# Patient Record
Sex: Female | Born: 1995 | Race: White | Hispanic: No | Marital: Single | Smoking: Never smoker
Health system: Southern US, Community
[De-identification: ages and names within clinical notes are randomized; demographics above are authoritative.]

## PROBLEM LIST (undated history)

## (undated) ENCOUNTER — Ambulatory Visit: Admission: EM | Payer: Managed Care, Other (non HMO) | Source: Home / Self Care

## (undated) HISTORY — PX: SALIVARY GLAND SURGERY: SHX768

---
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
## 2018-02-28 ENCOUNTER — Encounter: Payer: Self-pay | Admitting: Emergency Medicine

## 2018-02-28 ENCOUNTER — Other Ambulatory Visit: Payer: Self-pay

## 2018-02-28 ENCOUNTER — Ambulatory Visit
Admission: EM | Admit: 2018-02-28 | Discharge: 2018-02-28 | Disposition: A | Discharge status: Home / Self Care | Payer: 59 | Attending: Family Medicine | Admitting: Family Medicine

## 2018-02-28 DIAGNOSIS — J029 Acute pharyngitis, unspecified: Secondary | ICD-10-CM

## 2018-02-28 LAB — MONONUCLEOSIS SCREEN: MONO SCREEN: NEGATIVE

## 2018-02-28 LAB — RAPID STREP SCREEN (MED CTR MEBANE ONLY): Streptococcus, Group A Screen (Direct): NEGATIVE

## 2018-02-28 MED ORDER — BENZONATATE 100 MG PO CAPS: 100.0000 mg | ORAL_CAPSULE | Freq: Three times a day (TID) | ORAL | 0 refills | Status: AC | PRN

## 2018-02-28 NOTE — ED Provider Notes (Signed)
[facility]    CSN: [id number] Arrival date & time: 02/28/18  1328  History   Chief Complaint Chief Complaint  Patient presents with  . Sore Throat   HPI  22 year old female presents with the above complaint.  Patient reports that her symptoms started last Saturday.  Patient reports sore throat, cough, congestion, hoarseness.  Has also had left lower rib pain.  Roommate has had mononucleosis.  She is concerned about that.  No fever.  No known exacerbating or relieving factors.  No other associated symptoms.  No other complaints.  PMH, Surgical Hx, Family Hx, Social History reviewed and updated as below.  No significant PMH.  Past Surgical History:  Procedure Laterality Date  . SALIVARY GLAND SURGERY     due to stone production    OB History   None    Home Medications    Prior to Admission medications   Medication Sig Start Date End Date Taking? Authorizing Provider  LARISSIA 0.1-20 MG-MCG tablet  02/27/18  Yes [provider]  benzonatate (TESSALON) 100 MG capsule Take 1 capsule (100 mg total) by mouth 3 (three) times daily as needed. 02/28/18   Tommie Samsook, Khush Pasion G, DO    Family History Family History  Problem Relation Age of Onset  . Healthy Mother   . Healthy Father     Social History Social History   Tobacco Use  . Smoking status: Never Smoker  . Smokeless tobacco: Never Used  Substance Use Topics  . Alcohol use: Yes    Frequency: Never  . Drug use: Never     Allergies   Codeine and Sulfa antibiotics   Review of Systems Review of Systems Per HPI  Physical Exam Triage Vital Signs ED Triage Vitals  Enc Vitals Group     BP 02/28/18 1348 (!) 135/95     Pulse Rate 02/28/18 1348 85     Resp 02/28/18 1348 16     Temp 02/28/18 1348 (!) 97.5 F (36.4 C)     Temp Source 02/28/18 1348 Oral     SpO2 02/28/18 1348 98 %     Weight 02/28/18 1344 150 lb (68 kg)     Height 02/28/18 1344 5\' 2"  (1.575 m)     Head Circumference --    Peak Flow --      Pain Score 02/28/18 1344 3     Pain Loc --      Pain Edu? --      Excl. in GC? --    Updated Vital Signs BP (!) 135/95 (BP Location: Left Arm)   Pulse 85   Temp (!) 97.5 F (36.4 C) (Oral)   Resp 16   Ht 5\' 2"  (1.575 m)   Wt 68 kg   LMP 01/27/2018   SpO2 98%   BMI 27.44 kg/m   Visual Acuity Right Eye Distance:   Left Eye Distance:   Bilateral Distance:    Right Eye Near:   Left Eye Near:    Bilateral Near:     Physical Exam  Constitutional: She is oriented to person, place, and time. She appears well-developed. No distress.  HENT:  Head: Normocephalic and atraumatic.  Oropharynx with mild erythema.  No exudate.  Neck: Neck supple.  Cardiovascular: Normal rate and regular rhythm.  Pulmonary/Chest: Effort normal and breath sounds normal. She has no wheezes. She has no rales.  Lymphadenopathy:    She has no cervical adenopathy.  Neurological: She is alert and oriented to person, place, and  time.  Psychiatric: She has a normal mood and affect. Her behavior is normal.  Nursing note and vitals reviewed.  UC Treatments / Results  Labs (all labs ordered are listed, but only abnormal results are displayed) Labs Reviewed  RAPID STREP SCREEN ([facility] ONLY)  CULTURE, GROUP A STREP [facility])  MONONUCLEOSIS SCREEN    EKG None  Radiology No results found.  Procedures Procedures (including critical care time)  Medications Ordered in UC Medications - No data to display  Initial Impression / Assessment and Plan / UC Course  I have reviewed the triage vital signs and the nursing notes.  Pertinent labs & imaging results that were available during my care of the patient were reviewed by me and considered in my medical decision making (see chart for details).    22 year old female presents with a viral illness.  Strep negative.  Mono negative.  Clinically does not fit either.  Rest, fluids.  Tessalon Perles as needed for cough.  Supportive  care.  Final Clinical Impressions(s) / UC Diagnoses   Final diagnoses:  Viral pharyngitis     Discharge Instructions     Rest.   Fluids.  Medication as needed for cough.  Take care  Dr. Adriana Simas    ED Prescriptions    Medication Sig Dispense Auth. Provider   benzonatate (TESSALON) 100 MG capsule Take 1 capsule (100 mg total) by mouth 3 (three) times daily as needed. 30 capsule Tommie Sams, DO     Controlled Substance Prescriptions [place] Controlled Substance Registry consulted? Not Applicable   Tommie Sams, DO 02/28/18 1536

## 2018-02-28 NOTE — Discharge Instructions (Signed)
Rest.   Fluids.  Medication as needed for cough.  Take care  Dr. Adriana Simasook

## 2018-02-28 NOTE — ED Triage Notes (Signed)
Pt c/o sore throat, cough, congestion, hoarseness, and pain in her left side rib area. Started about a week ago. Her room mate has had mono for about 4 weeks and pt would like to rule that out.

## 2018-03-03 LAB — CULTURE, GROUP A STREP (THRC)

## 2021-03-27 ENCOUNTER — Other Ambulatory Visit: Payer: Self-pay

## 2021-03-28 ENCOUNTER — Ambulatory Visit: Payer: Managed Care, Other (non HMO)
